# Patient Record
Sex: Male | Born: 2005 | Race: Black or African American | Hispanic: No | Marital: Single
Health system: Southern US, Community
[De-identification: ages and names within clinical notes are randomized; demographics above are authoritative.]

## PROBLEM LIST (undated history)

## (undated) DIAGNOSIS — J302 Other seasonal allergic rhinitis: Secondary | ICD-10-CM

---
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
## 2024-01-21 ENCOUNTER — Ambulatory Visit (HOSPITAL_COMMUNITY)
Admission: EM | Admit: 2024-01-21 | Discharge: 2024-01-21 | Disposition: A | Discharge status: Home / Self Care | Payer: 59 | Attending: Family Medicine | Admitting: Family Medicine

## 2024-01-21 ENCOUNTER — Ambulatory Visit (INDEPENDENT_AMBULATORY_CARE_PROVIDER_SITE_OTHER): Payer: 59

## 2024-01-21 ENCOUNTER — Other Ambulatory Visit: Payer: Self-pay

## 2024-01-21 ENCOUNTER — Encounter (HOSPITAL_COMMUNITY): Payer: Self-pay | Admitting: *Deleted

## 2024-01-21 DIAGNOSIS — I440 Atrioventricular block, first degree: Secondary | ICD-10-CM | POA: Insufficient documentation

## 2024-01-21 DIAGNOSIS — R079 Chest pain, unspecified: Secondary | ICD-10-CM | POA: Insufficient documentation

## 2024-01-21 DIAGNOSIS — K449 Diaphragmatic hernia without obstruction or gangrene: Secondary | ICD-10-CM | POA: Insufficient documentation

## 2024-01-21 MED ORDER — FAMOTIDINE 20 MG PO TABS: 20.0000 mg | ORAL_TABLET | Freq: Two times a day (BID) | ORAL | 0 refills | Status: AC

## 2024-01-21 MED ORDER — IBUPROFEN 800 MG PO TABS
800.0000 mg | ORAL_TABLET | Freq: Once | ORAL | Status: AC
  Administered 2024-01-21: 800 mg via ORAL

## 2024-01-21 MED ORDER — IBUPROFEN 800 MG PO TABS
ORAL_TABLET | ORAL | Status: AC
  Filled 2024-01-21: qty 1

## 2024-01-21 NOTE — Discharge Instructions (Addendum)
 Overall your physical exam was reassuring.  Please start the Pepcid and take this twice daily to see if this will help with your hernia.  If your chest pain persists despite these interventions please follow-up with your primary care provider.  If your pain worsens or changes in any way seek immediate care at the nearest emergency department for further advanced evaluation.

## 2024-01-21 NOTE — ED Provider Notes (Signed)
 [facility]    CSN: [id number] Arrival date & time: 01/21/24  1003      History   Chief Complaint Chief Complaint  Patient presents with   Chest Pain    HPI Hector Mcdaniel is a 18 y.o. male.   Patient presents to clinic, accompanied by mother, for evaluation of left-sided chest pain that has been intermittent over the past week.  He will also have intermittent numbness that will radiate down his left arm for a few seconds and then resolved.  He has not had any coughing, wheezing, shortness of breath or recent illness.   Denies any previous medical history or any surgeries.  No alcohol use.  Does endorse marijuana use.  The history is provided by medical records, the patient and a parent.  Chest Pain   Past Medical History:  Diagnosis Date   Seasonal allergies     Patient Active Problem List   Diagnosis Date Noted   Inguinal lymphadenitis 09/20/2013    History reviewed. No pertinent surgical history.     Home Medications    Prior to Admission medications   Medication Sig Start Date End Date Taking? Authorizing Provider  famotidine (PEPCID) 20 MG tablet Take 1 tablet (20 mg total) by mouth 2 (two) times daily. 01/21/24  Yes Rinaldo Ratel, [place] N, FNP  cetirizine (ZYRTEC) 1 MG/ML syrup Take 5 mg by mouth daily.    [provider]  clindamycin (CLEOCIN) 75 MG/5ML solution Take 20 mLs (300 mg total) by mouth every 8 (eight) hours. Take for 8 more days 09/21/13   Nani Ravens, MD  oseltamivir (TAMIFLU) 75 MG capsule Take 1 capsule (75 mg total) by mouth every 12 (twelve) hours. 03/15/21   Ivette Loyal, NP    Family History History reviewed. No pertinent family history.  Social History Social History   Tobacco Use   Smoking status: Passive Smoke Exposure - Never Smoker   Smokeless tobacco: Never  Substance Use Topics   Alcohol use: No   Drug use: No     Allergies   Patient has no known allergies.   Review of Systems Review of  Systems  Per HPI   Physical Exam Triage Vital Signs ED Triage Vitals  Encounter Vitals Group     BP 01/21/24 1016 138/80     Systolic BP Percentile --      Diastolic BP Percentile --      Pulse Rate 01/21/24 1016 71     Resp 01/21/24 1016 16     Temp 01/21/24 1016 98.7 F (37.1 C)     Temp src --      SpO2 --      Weight --      Height --      Head Circumference --      Peak Flow --      Pain Score 01/21/24 1015 6     Pain Loc --      Pain Education --      Exclude from Growth Chart --    No data found.  Updated Vital Signs BP 138/80   Pulse 71   Temp 98.7 F (37.1 C)   Resp 16   Visual Acuity Right Eye Distance:   Left Eye Distance:   Bilateral Distance:    Right Eye Near:   Left Eye Near:    Bilateral Near:     Physical Exam Vitals and nursing note reviewed.  Constitutional:      Appearance: Normal appearance.  He is well-developed.  HENT:     Head: Normocephalic and atraumatic.     Right Ear: External ear normal.     Left Ear: External ear normal.     Nose: Nose normal.     Mouth/Throat:     Mouth: Mucous membranes are moist.  Eyes:     Conjunctiva/sclera: Conjunctivae normal.  Cardiovascular:     Rate and Rhythm: Normal rate and regular rhythm.     Heart sounds: Normal heart sounds. No murmur heard. Pulmonary:     Effort: Pulmonary effort is normal. No tachypnea.     Breath sounds: Normal breath sounds.  Chest:     Chest wall: No tenderness.  Musculoskeletal:        General: Normal range of motion.  Skin:    General: Skin is warm and dry.  Neurological:     General: No focal deficit present.     Mental Status: He is alert and oriented to person, place, and time.  Psychiatric:        Mood and Affect: Mood normal.        Behavior: Behavior normal.      UC Treatments / Results  Labs (all labs ordered are listed, but only abnormal results are displayed) Labs Reviewed - No data to display  EKG   Radiology DG Chest 2 View Result  Date: 01/21/2024 CLINICAL DATA:  Left-sided chest pain EXAM: CHEST - 2 VIEW COMPARISON:  None Available. FINDINGS: Focal diaphragmatic elevation of the anteromedial right hemidiaphragm. Normal lung volumes. No focal consolidations. No pleural effusion or pneumothorax. The heart size and mediastinal contours are within normal limits. No acute osseous abnormality. IMPRESSION: 1. No active cardiopulmonary disease. 2. Focal diaphragmatic elevation of the anteromedial right hemidiaphragm may represent diaphragmatic eventration or a small diaphragmatic hernia. Electronically Signed   By: Agustin Cree M.D.   On: 01/21/2024 11:03    Procedures Procedures (including critical care time)  Medications Ordered in UC Medications  ibuprofen (ADVIL) tablet 800 mg (800 mg Oral Given 01/21/24 1045)    Initial Impression / Assessment and Plan / UC Course  I have reviewed the triage vital signs and the nursing notes.  Pertinent labs & imaging results that were available during my care of the patient were reviewed by me and considered in my medical decision making (see chart for details).  Vitals and triage reviewed, patient is hemodynamically stable.  Lungs are vesicular, heart with regular rate and rhythm.  EKG shows a ventricular rate of 63 bpm, EKG just showed sinus rhythm with first-degree AV block.  No ST elevation or ST depression.  Patient admits to smoking marijuana, denies any other drug use.  Chest pain is not reproducible to palpation. HEAR score 0.  Discussed limitations of urgent care workup and lack of troponin, but overall workup is reassuring.  Chest x-ray does not show any acute cardiopulmonary disease, may have a diaphragmatic hernia, will trial Pepcid.  Strict emergency precautions given if symptoms evolve or worsen in any way, patient and mother agreeable to plan.  No questions at this time.     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  Please start the Pepcid and take this twice daily to see if this will help with your hernia.  If  your chest pain persists despite these interventions please follow-up with your primary care provider.  If your pain worsens or changes in any way seek immediate care at the nearest emergency department for further advanced evaluation.     ED Prescriptions     Medication Sig Dispense Auth. Provider   famotidine (PEPCID) 20 MG tablet Take 1 tablet (20 mg total) by mouth 2 (two) times daily. 30 tablet Zhara Gieske, [place] N, [place]      PDMP not reviewed this encounter.   Suvan Stcyr, [place] N, [place] 01/21/24 1115

## 2024-01-21 NOTE — ED Triage Notes (Signed)
 LT sided CP with numbness in Lt arm. Pt reports he has CP now.

## 2025-01-03 ENCOUNTER — Ambulatory Visit (HOSPITAL_COMMUNITY)
Admission: EM | Admit: 2025-01-03 | Discharge: 2025-01-03 | Disposition: A | Discharge status: Home / Self Care | Attending: Nurse Practitioner | Admitting: Nurse Practitioner

## 2025-01-03 ENCOUNTER — Encounter (HOSPITAL_COMMUNITY): Payer: Self-pay

## 2025-01-03 DIAGNOSIS — R079 Chest pain, unspecified: Secondary | ICD-10-CM | POA: Diagnosis not present

## 2025-01-03 NOTE — ED Provider Notes (Signed)
 " [facility]    CSN: [id number] Arrival date & time: 01/03/25  0844      History   Chief Complaint Chief Complaint  Patient presents with   Chest Pain    HPI Hector Mcdaniel is a 19 y.o. male.   Patient presents today with 1 month history of left sided chest pain.  He describes the pain as an ache and 3-4/10.  The pain is worse with activity like walking around at his job.  The pain lasts for a couple of minutes and gets better when he rests.  He has intermittent left arm numbness occasionally when he has the chest pain.  Also occasionally feels palpitations with the chest pain.  No shortness of breath, dizziness/lightheadedness, diaphoresis, heartburn/indigestion, or cough associated with the pain.  Reports the pain is worse at work when he is walking around; he works as a theatre stage manager.  Pain is not worse with movement or when he moves the arms.  No recent trauma to the chest.  Denies recent increase in anxiety/stress.  Patient and mother deny family history of heart attacks, heart disease, or strokes.  He has not taken anything for the chest pain so far.     Past Medical History:  Diagnosis Date   Seasonal allergies     Patient Active Problem List   Diagnosis Date Noted   Inguinal lymphadenitis 09/20/2013    History reviewed. No pertinent surgical history.     Home Medications    Prior to Admission medications  Medication Sig Start Date End Date Taking? Authorizing Provider  cetirizine  (ZYRTEC ) 1 MG/ML syrup Take 5 mg by mouth daily.    [provider]  clindamycin  (CLEOCIN ) 75 MG/5ML solution Take 20 mLs (300 mg total) by mouth every 8 (eight) hours. Take for 8 more days 09/21/13   Laverne Prentice HERO, MD  famotidine  (PEPCID ) 20 MG tablet Take 1 tablet (20 mg total) by mouth 2 (two) times daily. 01/21/24   Ball, [place]  G, FNP  oseltamivir  (TAMIFLU ) 75 MG capsule Take 1 capsule (75 mg total) by mouth every 12 (twelve) hours. 03/15/21   Claudene Ashley SAUNDERS, NP     Family History History reviewed. No pertinent family history.  Social History Social History[1]   Allergies   Patient has no known allergies.   Review of Systems Review of Systems Per HPI  Physical Exam Triage Vital Signs ED Triage Vitals  Encounter Vitals Group     BP 01/03/25 1000 131/70     Girls Systolic BP Percentile --      Girls Diastolic BP Percentile --      Boys Systolic BP Percentile --      Boys Diastolic BP Percentile --      Pulse Rate 01/03/25 1000 79     Resp 01/03/25 1000 17     Temp 01/03/25 1000 97.9 F (36.6 C)     Temp Source 01/03/25 1000 Oral     SpO2 01/03/25 1000 98 %     Weight --      Height --      Head Circumference --      Peak Flow --      Pain Score 01/03/25 0959 3     Pain Loc --      Pain Education --      Exclude from Growth Chart --    No data found.  Updated Vital Signs BP 131/70 (BP Location: Left Arm)   Pulse 79   Temp  97.9 F (36.6 C) (Oral)   Resp 17   SpO2 98%   Visual Acuity Right Eye Distance:   Left Eye Distance:   Bilateral Distance:    Right Eye Near:   Left Eye Near:    Bilateral Near:     Physical Exam Vitals and nursing note reviewed.  Constitutional:      General: He is not in acute distress.    Appearance: Normal appearance. He is not ill-appearing or toxic-appearing.  HENT:     Head: Normocephalic and atraumatic.     Right Ear: Tympanic membrane, ear canal and external ear normal.     Left Ear: Tympanic membrane, ear canal and external ear normal.     Nose: No congestion or rhinorrhea.     Mouth/Throat:     Mouth: Mucous membranes are moist.     Pharynx: Oropharynx is clear. No posterior oropharyngeal erythema.  Eyes:     General: No scleral icterus.    Extraocular Movements: Extraocular movements intact.  Cardiovascular:     Rate and Rhythm: Normal rate and regular rhythm. No extrasystoles are present.    Heart sounds: Normal heart sounds.  Pulmonary:     Effort: Pulmonary effort  is normal. No respiratory distress.     Breath sounds: Normal breath sounds. No wheezing, rhonchi or rales.  Chest:     Chest wall: No mass, deformity or tenderness.     Comments: Chest pain is not reproducible with palpation Musculoskeletal:     Cervical back: Normal range of motion and neck supple.  Lymphadenopathy:     Cervical: No cervical adenopathy.  Skin:    General: Skin is warm and dry.     Coloration: Skin is not jaundiced or pale.     Findings: No erythema or rash.  Neurological:     Mental Status: He is alert and oriented to person, place, and time.  Psychiatric:        Behavior: Behavior is cooperative.      UC Treatments / Results  Labs (all labs ordered are listed, but only abnormal results are displayed) Labs Reviewed - No data to display  EKG   Radiology No results found.  Procedures Procedures (including critical care time)  Medications Ordered in UC Medications - No data to display  Initial Impression / Assessment and Plan / UC Course  I have reviewed the triage vital signs and the nursing notes.  Pertinent labs & imaging results that were available during my care of the patient were reviewed by me and considered in my medical decision making (see chart for details).   Patient is a pleasant, well-appearing 19 year old male presenting today for chest pain.  EKG today shows ventricular rate of 68 bpm with first-degree AV heart block; there is T wave abnormality in in leads V5, V3, and V6, T wave is inverted when compared with previous EKG about 1 year ago.  For this reason, coupled with ongoing intermittent chest pain, recommended consultation with cardiologist and referral was placed today.  Patient and mother are in agreement to plan.  Strict ER precautions discussed in the meantime if chest pain recurs.  The patient was given the opportunity to ask questions.  All questions answered to their satisfaction.  The patient is in agreement to this plan.    Final Clinical Impressions(s) / UC Diagnoses   Final diagnoses:  Chest pain, unspecified type     Discharge Instructions      The EKG today shows some changes  when compared with the EKG last year.  I have put in a referral for you to see a Cardiologist about this and the chest pain you have been having.  Please call them to schedule an appointment; contact information has been provided.  If the chest pain comes back, worsens, or lasts for more than 5 minutes, please call 911 or go to the ER.    ED Prescriptions   None    PDMP not reviewed this encounter.     [1]  Social History Tobacco Use   Smoking status: Passive Smoke Exposure - Never Smoker   Smokeless tobacco: Never  Substance Use Topics   Alcohol use: No   Drug use: No     Chandra Harlene LABOR, NP 01/03/25 1440  "

## 2025-01-03 NOTE — Discharge Instructions (Addendum)
 The EKG today shows some changes when compared with the EKG last year.  I have put in a referral for you to see a Cardiologist about this and the chest pain you have been having.  Please call them to schedule an appointment; contact information has been provided.  If the chest pain comes back, worsens, or lasts for more than 5 minutes, please call 911 or go to the ER.

## 2025-01-03 NOTE — ED Triage Notes (Signed)
 Left side chest pain ongoing for 1 month. States it feel like the is a hard blockage in this area. Denies any other symptoms with the chest pain. No history of heart problems.   Patient has not taken any meds for current symptoms.

## 2025-01-08 ENCOUNTER — Ambulatory Visit: Admitting: Internal Medicine

## 2025-01-08 ENCOUNTER — Encounter: Payer: Self-pay | Admitting: Internal Medicine

## 2025-01-08 VITALS — BP 115/69 | HR 57 | Ht 67.0 in | Wt 196.2 lb

## 2025-01-08 DIAGNOSIS — R9431 Abnormal electrocardiogram [ECG] [EKG]: Secondary | ICD-10-CM

## 2025-01-08 DIAGNOSIS — R079 Chest pain, unspecified: Secondary | ICD-10-CM | POA: Diagnosis not present

## 2025-01-08 DIAGNOSIS — F1210 Cannabis abuse, uncomplicated: Secondary | ICD-10-CM

## 2025-01-08 DIAGNOSIS — Z716 Tobacco abuse counseling: Secondary | ICD-10-CM | POA: Diagnosis not present

## 2025-01-08 DIAGNOSIS — Z720 Tobacco use: Secondary | ICD-10-CM | POA: Diagnosis not present

## 2025-01-08 NOTE — Patient Instructions (Signed)
 Medication Instructions:  Your physician recommends that you continue on your current medications as directed. Please refer to the Current Medication list given to you today.  *If you need a refill on your cardiac medications before your next appointment, please call your pharmacy*  Testing/Procedures: Your physician has requested that you have an echocardiogram. Echocardiography is a painless test that uses sound waves to create images of your heart. It provides your doctor with information about the size and shape of your heart and how well your hearts chambers and valves are working. This procedure takes approximately one hour. There are no restrictions for this procedure. Please do NOT wear cologne, perfume, aftershave, or lotions (deodorant is allowed). Please arrive 15 minutes prior to your appointment time.  Please note: We ask at that you not bring children with you during ultrasound (echo/ vascular) testing. Due to room size and safety concerns, children are not allowed in the ultrasound rooms during exams. Our front office staff cannot provide observation of children in our lobby area while testing is being conducted. An adult accompanying a patient to their appointment will only be allowed in the ultrasound room at the discretion of the ultrasound technician under special circumstances. We apologize for any inconvenience.   Follow-Up: At [facility], you and your health needs are our priority.  As part of our continuing mission to provide you with exceptional heart care, our providers are all part of one team.  This team includes your primary Cardiologist (physician) and Advanced Practice Providers or APPs (Physician Assistants and Nurse Practitioners) who all work together to provide you with the care you need, when you need it.  Your next appointment:   4 week(s)  Provider:   Emeline FORBES Calender, DO

## 2025-01-08 NOTE — Progress Notes (Signed)
 [facility] Cardiology Office Note:  .   Date:  01/08/2025  ID:  Hector Mcdaniel, DOB [birth year], MRN [id number] PCP: [facility]  [facility] Providers Cardiologist:  Emeline FORBES Calender, DO    History of Present Illness: .     Discussed the use of AI scribe software for clinical note transcription with the patient, who gave verbal consent to proceed.  History of Present Illness Hector Mcdaniel is a 19 year old male who presents with left-sided chest pain.  Chest pain - Left-sided, achy pain present for approximately one month - Pain occurs with stretching and while working, including standing - Not worsened by walking, climbing stairs, or general movement - Stretching provides partial relief, but pain persists - No recent illness, heavy lifting, or activities such as shoveling snow preceding onset  Associated symptoms - Occasional left arm numbness accompanying chest pain - Intermittent palpitations with chest pain - No shortness of breath, syncope, or other cardiac symptoms  Electrocardiogram findings - EKG at urgent care showed normal size rhythm - First degree AV block - T wave inversions in leads V3 to V6, II, III, and aVF  Substance use - Occasional marijuana use - Vapes nicotine - No cigarette smoking, alcohol use, or other drug use  Family history - No family history of cardiac issues or sudden cardiac death at a young age          ROS: Remaining review of systems negative  Studies Reviewed: SABRA   EKG Interpretation Date/Time:  Wednesday January 08 2025 11:19:55 EST Ventricular Rate:  57 PR Interval:  228 QRS Duration:  90 QT Interval:  382 QTC Calculation: 371 R Axis:   56  Text Interpretation: Sinus bradycardia with 1st degree A-V block with Premature atrial complexes T wave abnormality, consider inferolateral ischemia Abnormal ECG When compared with ECG of 03-Jan-2025 10:19, Premature atrial complexes are now  Present Confirmed by Calender Emeline [phone]) on 01/08/2025 11:22:46 AM    Results Diagnostic EKG (01/03/2025): Normal sinus rhythm with first degree AV block and T wave inversions in V3 to V6, II, III, and aVF Risk Assessment/Calculations:             Physical Exam:   VS:  BP 115/69 (BP Location: Left Arm)   Pulse (!) 57   Ht 5' 7 (1.702 m)   Wt 196 lb 3.2 oz (89 kg)   SpO2 97%   BMI 30.73 kg/m    Wt Readings from Last 3 Encounters:  01/08/25 196 lb 3.2 oz (89 kg) (91%, Z= 1.37)*  03/15/21 (!) 219 lb (99.3 kg) (>99%, Z= 2.50)*  12/17/20 (!) 217 lb (98.4 kg) (>99%, Z= 2.52)*   * Growth percentiles are based on CDC (Boys, 2-20 Years) data.    GEN: Well nourished, well developed in no acute distress NECK: No JVD; No carotid bruits CARDIAC: RRR, no murmurs, no rubs, no gallops RESPIRATORY:  Clear to auscultation without rales, wheezing or rhonchi  ABDOMEN: Soft, non-tender, non-distended EXTREMITIES:  No edema; No deformity   ASSESSMENT AND PLAN: .    Assessment and Plan Assessment & Plan Chest pain, likely noncardiac Intermittent left-sided chest pain likely musculoskeletal occurring with stretching and on exertion related, possibly occupational-related. - Ordered echocardiogram to assess heart structure and function.  Abnormal electrocardiogram EKG showed first degree AV block and T wave inversions in inferior lateral leads. Etiology unclear - Ordered echocardiogram to evaluate for HCM or other abnormalities  Nicotine dependence Vaping nicotine.  Advised cessation due to cardiovascular and pulmonary risks. - Advised cessation of smoking and vaping.  Cannabis use Occasional marijuana use. No specific cardiovascular concerns discussed. - Advised cessation of smoking and vaping.            Follow up: 4 to 6 weeks  Signed, Emeline FORBES Calender, DO  01/08/2025 11:42 AM    [facility]

## 2025-02-12 ENCOUNTER — Ambulatory Visit: Admitting: Internal Medicine

## 2025-02-18 ENCOUNTER — Ambulatory Visit (HOSPITAL_COMMUNITY)
